# Patient Record
Sex: Female | Born: 2005
Health system: Southern US, Community
[De-identification: ages and names within clinical notes are randomized; demographics above are authoritative.]

## PROBLEM LIST (undated history)

## (undated) DIAGNOSIS — F909 Attention-deficit hyperactivity disorder, unspecified type: Secondary | ICD-10-CM

## (undated) DIAGNOSIS — F32A Depression, unspecified: Secondary | ICD-10-CM

## (undated) DIAGNOSIS — E282 Polycystic ovarian syndrome: Secondary | ICD-10-CM

## (undated) HISTORY — DX: Attention-deficit hyperactivity disorder, unspecified type: F90.9

---
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
## 2023-01-24 IMAGING — US US PELVIS COMPLETE
1 series · 13 of 25 positions shown · non-contrast
Comparison: None.

CLINICAL DATA: LEFT lower quadrant pain

EXAM:
TRANSABDOMINAL ULTRASOUND OF PELVIS
DOPPLER ULTRASOUND OF OVARIES
TECHNIQUE: Transabdominal ultrasound examination of the pelvis was performed
including evaluation of the uterus, ovaries, adnexal regions, and
pelvic cul-de-sac.
Color and duplex Doppler ultrasound was utilized to evaluate blood
flow to the ovaries.

[Series 1: us pelvis (transabdominal only) · 13 of 97 slices shown]
[im 1/97]
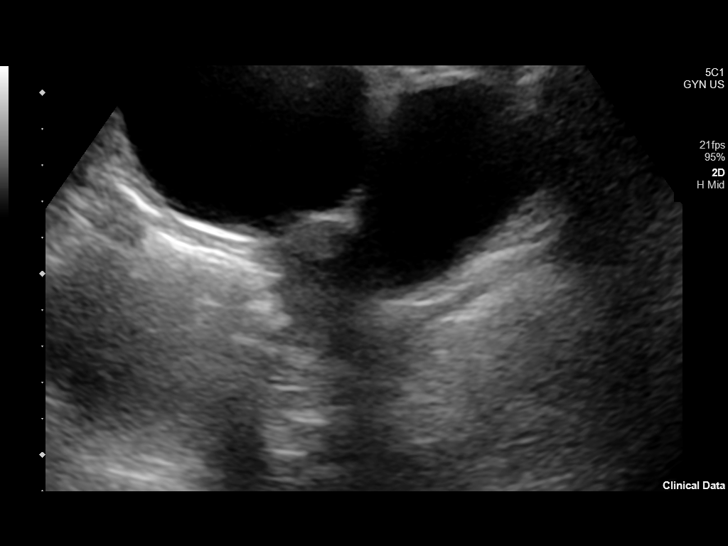
[im 9/97]
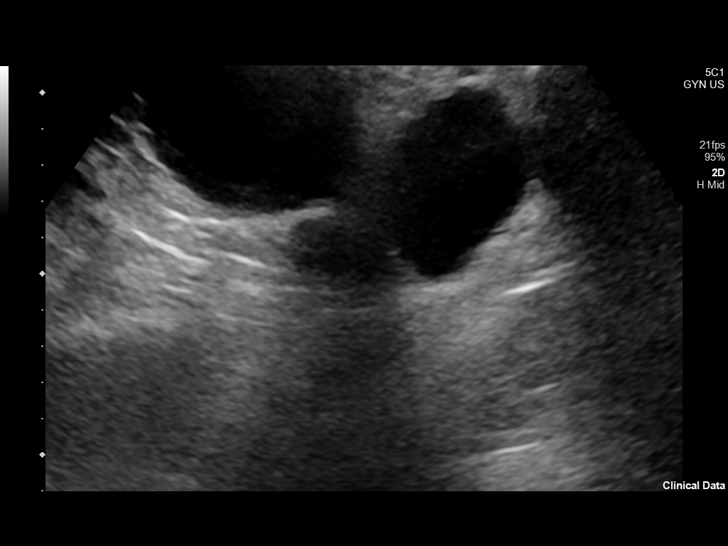
[im 17/97]
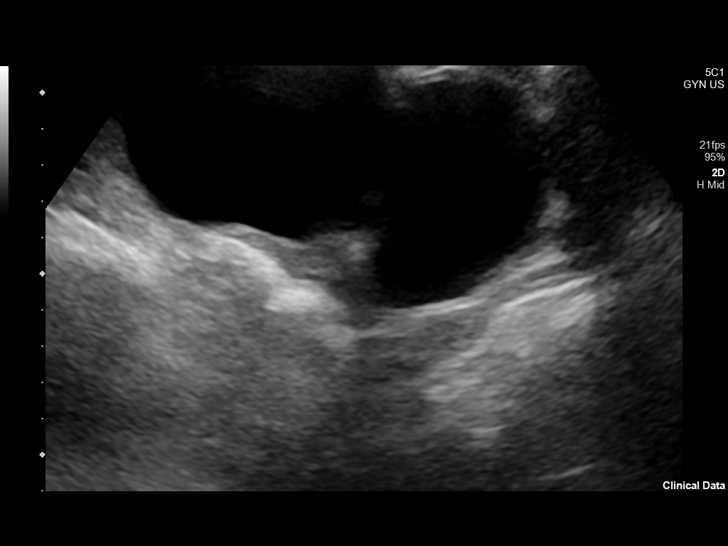
[im 25/97]
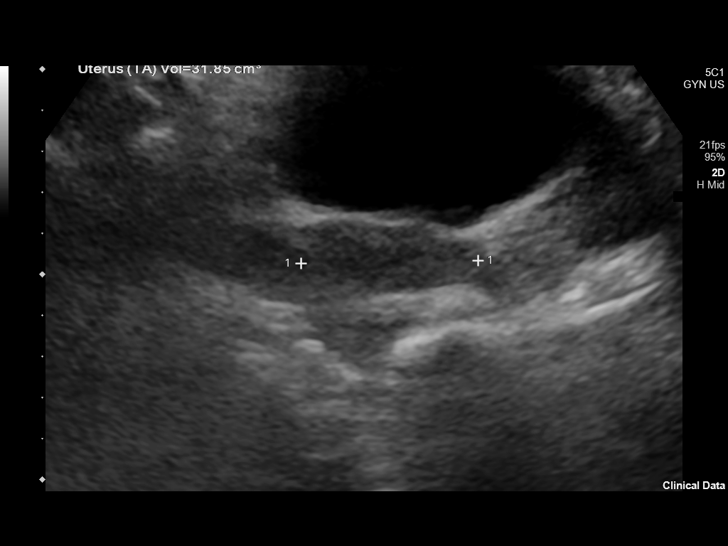
[im 33/97]
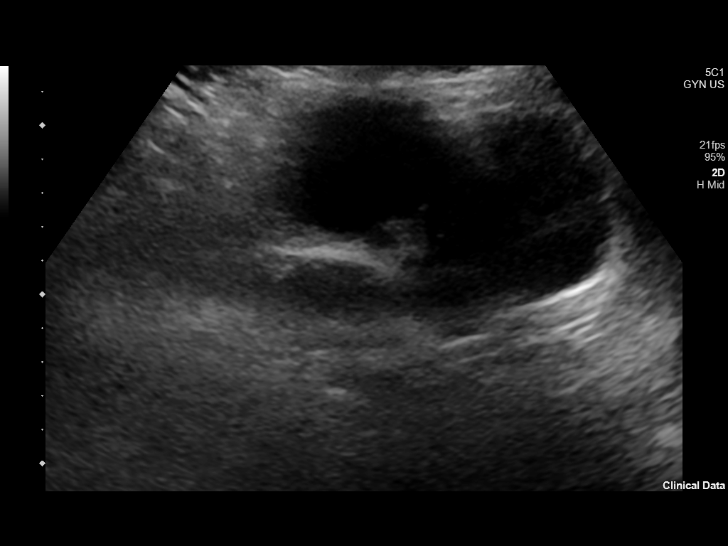
[im 41/97]
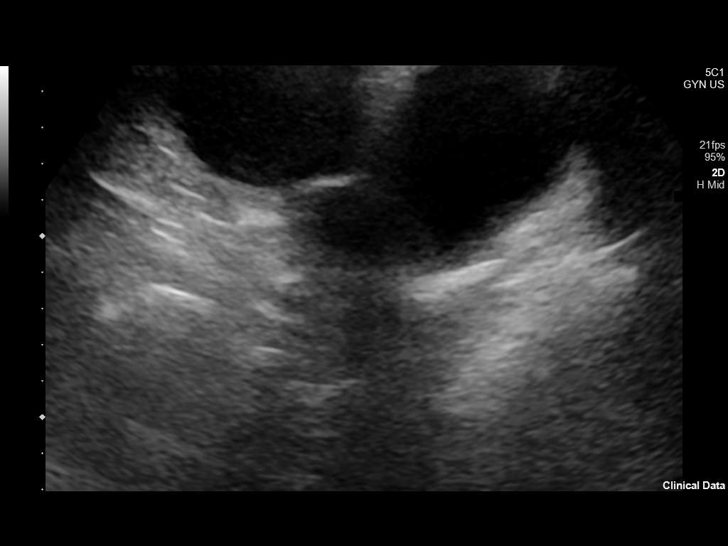
[im 49/97]
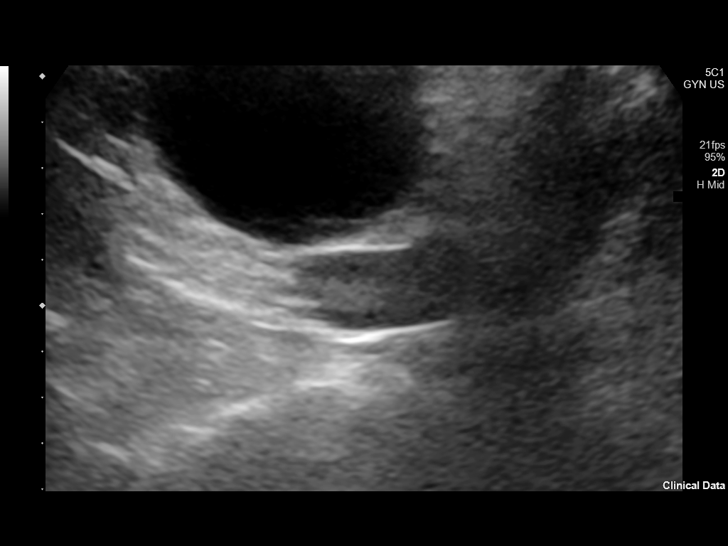
[im 57/97]
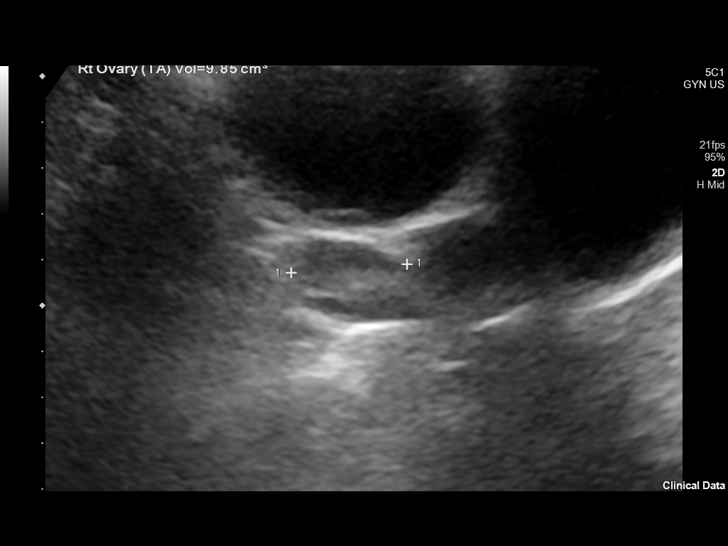
[im 65/97]
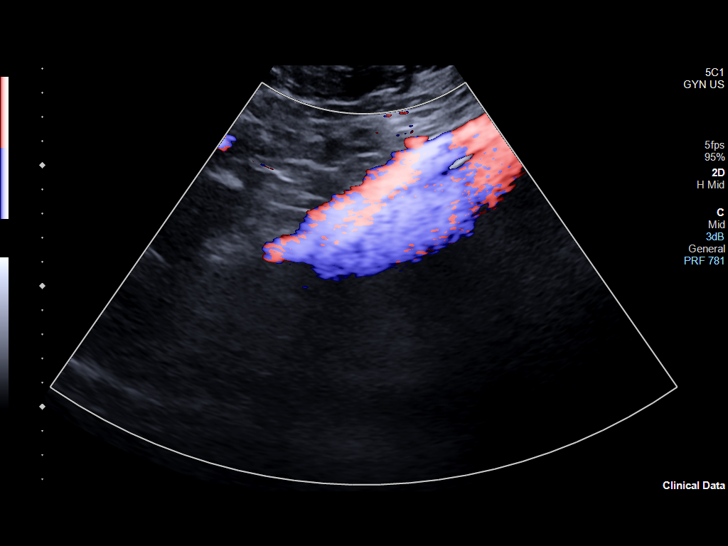
[im 73/97]
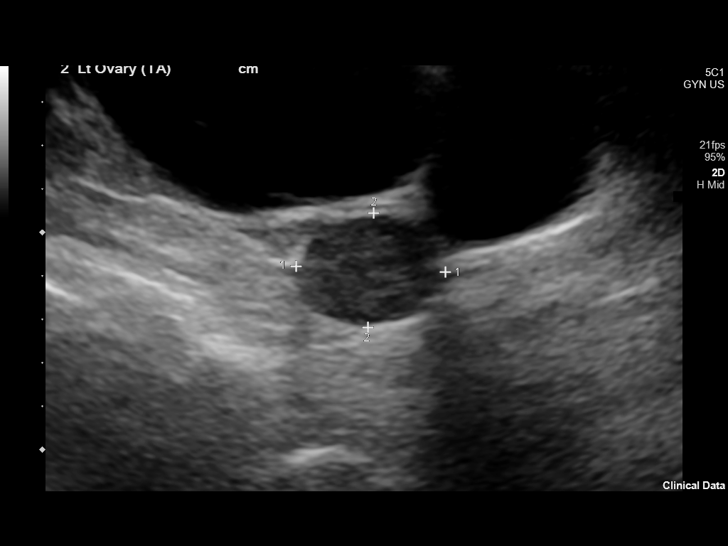
[im 81/97]
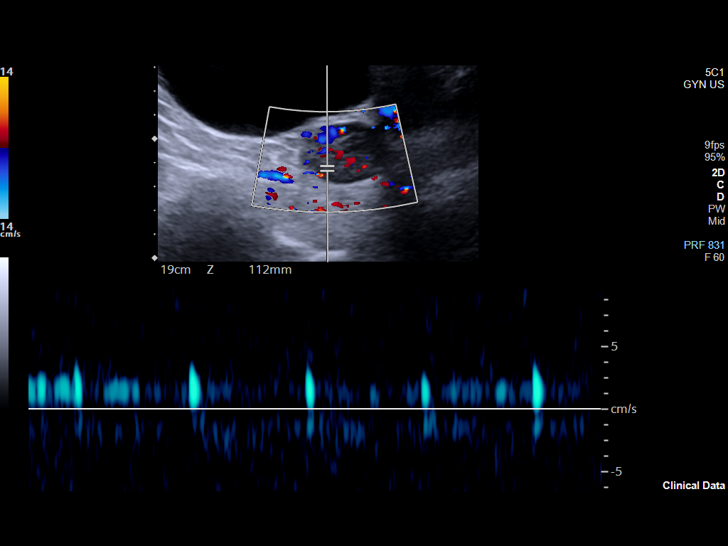
[im 89/97]
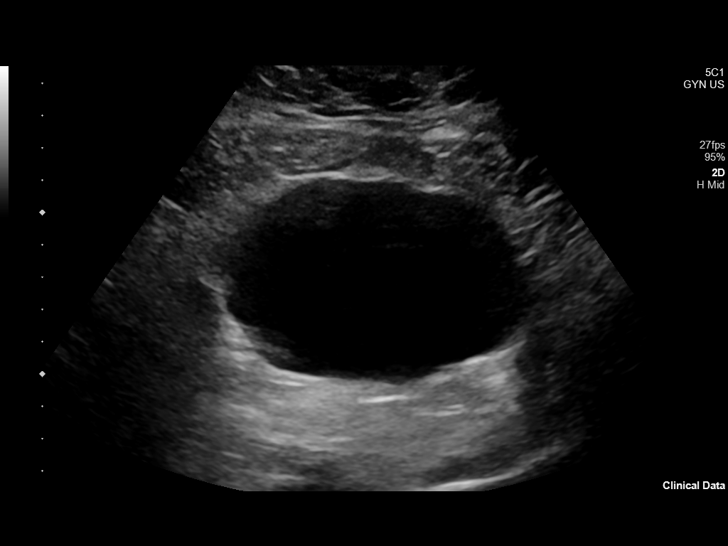
[im 97/97]
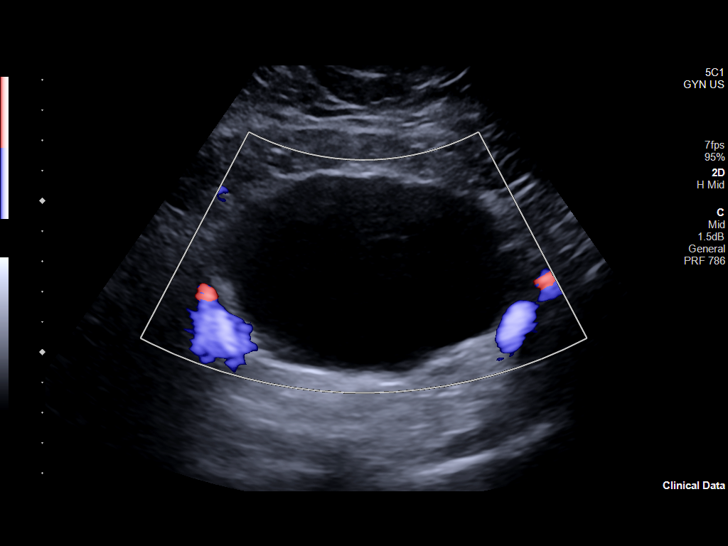

[13 of 25 positions shown; findings below may reference images not displayed]

FINDINGS: Uterus

Limited visualization. Measurements: 5.2 x 2.7 x 4.3 cm = volume: 32
mL. No fibroids or other mass visualized.

Endometrium

Limited visualization given transabdominal technique.

Right ovary

Measurements: 4.1 x 1.8 x 2.5 cm = volume: 10 mL.

Left ovary

Measurements: 3.4 x 2.6 x 3.8 cm = volume: 18 mL.

Pulsed Doppler evaluation demonstrates normal low-resistance
arterial and venous waveforms in both ovaries.

Other: There is a midline simple appearing cystic mass which appears
distinct from the bladder candidate. It measures 8.3 x 6.0 x 9.2 cm.
This likely reflects adnexal cyst although is difficult to delineate
from which adnexa this arises. No internal blood flow is identified
within this mass.
IMPRESSION: 1. There is a midline simple appearing 9.2 cm cystic mass which
appears to be distinct from adjacent bladder; evaluation is limited
due to transabdominal technique. This may reflect an adnexal cyst
and could reflect a source of pain and potential focus for
intermittent torsion. Blood flow is documented within the bilateral
ovaries. This could be further assessed with dedicated
cross-sectional imaging with contrast enhanced abdomen/pelvis CT or
pelvic MRI.

## 2023-05-28 ENCOUNTER — Encounter (INDEPENDENT_AMBULATORY_CARE_PROVIDER_SITE_OTHER): Payer: Self-pay

## 2023-06-12 ENCOUNTER — Encounter: Payer: Self-pay | Admitting: Obstetrics and Gynecology

## 2023-06-12 ENCOUNTER — Ambulatory Visit: Payer: 59 | Admitting: Obstetrics and Gynecology

## 2023-06-12 ENCOUNTER — Other Ambulatory Visit (HOSPITAL_COMMUNITY)
Admission: RE | Admit: 2023-06-12 | Discharge: 2023-06-12 | Disposition: A | Discharge status: Home / Self Care | Payer: 59 | Source: Ambulatory Visit | Attending: Obstetrics and Gynecology | Admitting: Obstetrics and Gynecology

## 2023-06-12 VITALS — BP 126/82 | HR 101 | Ht 67.0 in | Wt 288.0 lb

## 2023-06-12 DIAGNOSIS — Z113 Encounter for screening for infections with a predominantly sexual mode of transmission: Secondary | ICD-10-CM | POA: Insufficient documentation

## 2023-06-12 DIAGNOSIS — Z308 Encounter for other contraceptive management: Secondary | ICD-10-CM | POA: Diagnosis not present

## 2023-06-12 DIAGNOSIS — N921 Excessive and frequent menstruation with irregular cycle: Secondary | ICD-10-CM

## 2023-06-12 MED ORDER — NORELGESTROMIN-ETH ESTRADIOL 150-35 MCG/24HR TD PTWK: 1.0000 | MEDICATED_PATCH | TRANSDERMAL | 6 refills | Status: DC

## 2023-06-12 NOTE — Progress Notes (Signed)
GYNECOLOGY  VISIT   HPI: 18 y.o.   Single  Caucasian female   No obstetric history on file. with Patient's last menstrual period was 05/29/2023.   here for: change BC. Pt has irregular cycles and pressure.     Taking her birth control regularly overall.  Takes the pill late once or twice a month.  Is having some spotting and cramping.  Spotting about 4 times a month when she is not on her cycle. Cramps even if she is not bleeding.   Is sexually active.  She states that this starts her bleeding. Partner is female biologically.  Using condoms.   Concerned about an ovarian cyst. Hx torsion.  No dysuria.  Voiding normally.   GYNECOLOGIC HISTORY: Patient's last menstrual period was 05/29/2023. Contraception:  OCP Menopausal hormone therapy:  n/a Last 2 paps:  n/a History of abnormal Pap or positive HPV:  no Mammogram:  n/a        OB History   No obstetric history on file.        Patient Active Problem List   Diagnosis Date Noted   Cyst of left ovary    LLQ pain    Torsion of left ovary and ovarian pedicle    Prediabetes 06/03/2016   Vitamin D deficiency 06/03/2016   BMI (body mass index), pediatric, greater than or equal to 95% for age 37/19/2018    Past Medical History:  Diagnosis Date   ADHD (attention deficit hyperactivity disorder)    Depressed    PCO (polycystic ovaries)     Past Surgical History:  Procedure Laterality Date   ROBOTIC ASSISTED LAPAROSCOPIC OVARIAN CYSTECTOMY Left 05/05/2021   Procedure: XI ROBOTIC ASSISTED LAPAROSCOPIC OVARIAN CYSTECTOMY;  Surgeon: Natale Milch, MD;  Location: [facility] ORS;  Service: Gynecology;  Laterality: Left;    Current Outpatient Medications  Medication Sig Dispense Refill   albuterol (VENTOLIN HFA) 108 (90 Base) MCG/ACT inhaler Inhale into the lungs.     ibuprofen (ADVIL) 600 MG tablet Take 1 tablet (600 mg total) by mouth every 6 (six) hours as needed. 60 tablet 0   norethindrone-ethinyl estradiol-iron  (JUNEL FE 1.5/30) 1.5-30 MG-MCG tablet Take 1 active pill daily x 12 weeks, then take 1 inactive pill daily x 7 days 112 tablet 3   No current facility-administered medications for this visit.     ALLERGIES: Patient has no known allergies.  History reviewed. No pertinent family history.  Social History   Socioeconomic History   Marital status: Single    Spouse name: Not on file   Number of children: Not on file   Years of education: Not on file   Highest education level: Not on file  Occupational History   Not on file  Tobacco Use   Smoking status: Never    Passive exposure: Yes   Smokeless tobacco: Never  Vaping Use   Vaping status: Never Used  Substance and Sexual Activity   Alcohol use: Not Currently    Comment: occas   Drug use: Not Currently    Types: Marijuana    Comment: not often   Sexual activity: Yes  Other Topics Concern   Not on file  Social History Narrative   [employer] 12th grade- in spring 2024 going to [facility] for cosmatology    Lives with mom and siblings mom and dad split time   Has pets a lot of them    Enjoys playing video games. Drawing and painting.    Social Drivers  of Health   Financial Resource Strain: Not on file  Food Insecurity: Not on file  Transportation Needs: Not on file  Physical Activity: Not on file  Stress: Not on file  Social Connections: Not on file  Intimate Partner Violence: Not on file    Review of Systems  All other systems reviewed and are negative.   PHYSICAL EXAMINATION:   BP 126/82 (BP Location: Left Arm, Patient Position: Sitting, Cuff Size: Large)   Pulse (!) 101   Ht 5\' 7"  (1.702 m)   Wt 288 lb (130.6 kg)   LMP 05/29/2023   SpO2 99%   BMI 45.11 kg/m     General appearance: alert, cooperative and appears stated age   Pelvic: External genitalia:  no lesions              Urethra:  normal appearing urethra with no masses, tenderness or lesions              Bartholins and Skenes: normal                  Vagina: normal appearing vagina with normal color and discharge, no lesions              Cervix: no lesions                Bimanual Exam:  Uterus:  normal size, contour, position, consistency, mobility, non-tender              Adnexa: no mass, fullness, tenderness     Chaperone was present for exam:  Warren Lacy, CMA  ASSESSMENT:  Breakthrough bleeding on birth control.  Pelvic pressure. Normal pelvic exam today. Hx robotic assisted ovarian cystectomy for benign serous cystadenofibroma. Had a torsion.  STD screening.   Elevated testosterone.  Probable PCOS. Elevated DHEA-S.  Followed by endocrinology. Possible migraine without aura.  PLAN:  We discussed breakthrough bleeding with birth control.  Contraceptive options of switching to another birth control pill, OrthoEvra, NuvaRing, Nexplanon, Depo Provera, IUDs discussed.  She chooses Chief Strategy Officer.  Instructed in use and in side effects.  GC/CT/trich testing.  Condoms recommended.  Declines pelvic US.  Follow up for annual exam and prn.    30 min  total time was spent for this patient encounter, including preparation, face-to-face counseling with the patient, coordination of care, and documentation of the encounter.

## 2023-06-12 NOTE — Patient Instructions (Signed)
Ethinyl Estradiol; Norelgestromin Patches What is this medication? ETHINYL ESTRADIOL;NORELGESTROMIN (ETH in il es tra DYE ole; nor el JES troe min) prevents ovulation and pregnancy. It belongs to a group of medications called contraceptives. It is a combination of the hormones estrogen and progestin. This medicine may be used for other purposes; ask your health care provider or pharmacist if you have questions. COMMON BRAND NAME(S): Ortho Christianne Borrow, ZAFEMY What should I tell my care team before I take this medication? They need to know if you have or ever had any of these conditions: Abnormal vaginal bleeding Blood clots Blood vessel disease Breast, cervical, endometrial, ovarian, liver, or uterine cancer Diabetes Gallbladder disease Having surgery Heart disease or recent heart attack High blood pressure High cholesterol or triglycerides History of irregular heartbeat or heart valve problems Kidney disease Liver disease Lupus Migraine headaches Protein C or S deficiency Recently had a baby, miscarriage, or abortion Stroke Tobacco use An unusual or allergic reaction to estrogens, progestins, other medications, foods, dyes, or preservatives Pregnant or trying to get pregnant Breastfeeding How should I use this medication? This patch is applied to the skin. Follow the directions on the prescription label. Apply to clean, dry, healthy skin on the buttock, abdomen, upper outer arm or upper torso, in a place where it will not be rubbed by tight clothing. Do not use lotions or other cosmetics on the site where the patch will go. Press the patch firmly in place for 10 seconds to ensure good contact with the skin. Change the patch every 7 days on the same day of the week for 3 weeks. You will then have a break from the patch for 1 week, after which you will apply a new patch. Do not use your medication more often than directed. Contact your care team about the use of this medication in  children. Special care may be needed. This medication has been used in female children who have started having menstrual periods. A patient package insert for the product will be given with each prescription and refill. Read this sheet carefully each time. The sheet may change frequently. Overdosage: If you think you have taken too much of this medicine contact a poison control center or emergency room at once. NOTE: This medicine is only for you. Do not share this medicine with others. What if I miss a dose? You will need to replace your patch once a week as directed. If your patch is lost or falls off, contact your care team for advice. You may need to use another form of birth control if your patch has been off for more than 1 day. What may interact with this medication? Do not take this medication with the following: Dasabuvir; ombitasvir; paritaprevir; ritonavir Ombitasvir; paritaprevir; ritonavir This medication may also interact with the following: Acetaminophen Antibiotics or medications for infections, especially rifampin, rifabutin, rifapentine, penicillins, or tetracyclines Aprepitant or fosaprepitant Armodafinil Ascorbic acid (vitamin C) Barbiturate medications, such as phenobarbital or primidone Bosentan Certain antivirals for HIV or hepatitis Certain medications for cancer treatment Certain medications for cholesterol Certain medications for seizures, such as carbamazepine, clobazam, felbamate, lamotrigine, oxcarbazepine, phenytoin, rufinamide, or topiramate Cyclosporine Dantrolene Elagolix Flibanserin Grapefruit juice Lesinurad Medications for diabetes Medications to treat fungal infections, such as griseofulvin, miconazole, fluconazole, ketoconazole, itraconazole, posaconazole, or voriconazole Mifepristone Mitotane Modafinil Morphine Mycophenolate St. John's wort Tamoxifen Temazepam Theophylline or aminophylline Thyroid hormones Tizanidine Tranexamic  acid Ulipristal Warfarin This list may not describe all possible interactions. Give your health care provider  a list of all the medicines, herbs, non-prescription drugs, or dietary supplements you use. Also tell them if you smoke, drink alcohol, or use illegal drugs. Some items may interact with your medicine. What should I watch for while using this medication? Visit your care team for regular checks on your progress. You will need a regular breast and pelvic exam and Pap smear while on this medication. Use an additional method of contraception during the first cycle that you use this patch. If you have any reason to think you are pregnant, stop using this medication right away and contact your care team. If you are using this medication for hormone related problems, it may take several cycles of use to see improvement in your condition. Smoking tobacco increases the risk of getting a blood clot or having a stroke while you are taking this medication, especially if you are older than 35 years. This medication can make your body retain fluid, making your fingers, hands, or ankles swell. Your blood pressure can go up. Contact your care team if you feel you are retaining fluid. This medication can make you more sensitive to the sun. Keep out of the sun. If you cannot avoid being in the sun, wear protective clothing and sunscreen. Do not use sun lamps, tanning beds, or tanning booths. If you wear contact lenses and notice visual changes, or if the lenses begin to feel uncomfortable, consult your eye care specialist. Tenderness, swelling, or minor bleeding of the gums may occur. Talk to your dentist if this happens. Brushing and flossing your teeth regularly may reduce the risk of side effects. Visit your dentist on a regular basis. Tell your dentist about any medications you are taking. If you are going to have elective surgery or an MRI, tell your care team that you are using this medication. You may  need to remove the patch before the procedure. Using this medication does not protect you or your partner against HIV or other sexually transmitted infections (STIs). What side effects may I notice from receiving this medication? Side effects that you should report to your care team as soon as possible: Allergic reactions--skin rash, itching, hives, swelling of the face, lips, tongue, or throat Blood clot--pain, swelling, or warmth in the leg, shortness of breath, chest pain Gallbladder problems--severe stomach pain, nausea, vomiting, fever Increase in blood pressure Liver injury--right upper belly pain, loss of appetite, nausea, light-colored stool, dark yellow or brown urine, yellowing skin or eyes, unusual weakness or fatigue New or worsening migraines or headaches Stroke--sudden numbness or weakness of the face, arm, or leg, trouble speaking, confusion, trouble walking, loss of balance or coordination, dizziness, severe headache, change in vision Unusual vaginal discharge, itching, or odor Worsening mood, feelings of depression Side effects that usually do not require medical attention (report to your care team if they continue or are bothersome): Breast pain or tenderness Dark patches of skin on the face or other sun-exposed areas Irregular menstrual cycles or spotting Nausea Weight gain This list may not describe all possible side effects. Call your doctor for medical advice about side effects. You may report side effects to FDA at 1-800-FDA-1088. Where should I keep my medication? Keep out of the reach of children and pets. Store at room temperature between 15 and 30 degrees C (59 and 86 degrees F). Keep the patch in its pouch until time of use. Throw away any unused medication after the expiration date. Dispose of used patches properly. Since a used patch may  still contain active hormones, fold the patch in half so that it sticks to itself prior to disposal. Throw away in a place where  children or pets cannot reach. NOTE: This sheet is a summary. It may not cover all possible information. If you have questions about this medicine, talk to your doctor, pharmacist, or health care provider.  2024 Elsevier/Gold Standard (2021-12-04 00:00:00)

## 2023-06-13 LAB — CERVICOVAGINAL ANCILLARY ONLY
Chlamydia: NEGATIVE
Comment: NEGATIVE
Comment: NEGATIVE
Comment: NORMAL
Neisseria Gonorrhea: NEGATIVE
Trichomonas: NEGATIVE

## 2023-06-16 ENCOUNTER — Encounter: Payer: Self-pay | Admitting: Obstetrics and Gynecology

## 2023-07-21 ENCOUNTER — Encounter: Payer: Self-pay | Admitting: Obstetrics and Gynecology

## 2023-07-21 NOTE — Telephone Encounter (Signed)
 Patient also left message on triage line.   Spoke with patient. Reports hard right breast lump found on SBE 07/19/23. LMP 07/20/23. Right nipple is dry. Denies any other symptoms.   OV scheduled for 3/13/ at 1000 with Dr. Edward Jolly.   Recommended patient not press or squeeze lump in the meantime. Can take OTC motrin 800 mg q8hrs for pain, if needed. Advised once OV complete our office will assist with scheduling any f/u needed.   Routing to provider for final review. Patient is agreeable to disposition. Will close encounter.

## 2023-07-23 ENCOUNTER — Encounter: Payer: Self-pay | Admitting: Obstetrics and Gynecology

## 2023-07-23 NOTE — Progress Notes (Unsigned)
 GYNECOLOGY  VISIT   HPI: 18 y.o.   Single  Caucasian female   No obstetric history on file. with Patient's last menstrual period was 07/19/2023 (exact date).   here for: right breast lump noticed since 07/19/2023. Pt reports feeling a hard spot, marble sized, but feels harder around edges. Soreness with palpation. Reports fx hx in mother and maternal grandmother w/ breast cancer. Also, has hx of endometrial cancer in paternal Grandmother, and ovarian and breast cancer in paternal great grandmother.  Denies nipple discharge but dry skin. Pt also concerned with possible allergy with BC patches.   Mood swings and increased cramping with her last cycle with use of Ortho Evra, but no symptoms if not on her cycle.  The skin on her abdomen has red marks where a patch has been placed.  Was using Junel birth control pills previously.   Mother diagnosed with breast cancer at age 20 yo.  Negative genetic testing for BRCA.    Step mother is with her today.   GYNECOLOGIC HISTORY: Patient's last menstrual period was 07/19/2023 (exact date). Contraception: Patch Menopausal hormone therapy: n/a Last 2 paps: n/a History of abnormal Pap or positive HPV: n/a Mammogram: n/a        OB History   No obstetric history on file.        Patient Active Problem List   Diagnosis Date Noted   Cyst of left ovary    LLQ pain    Torsion of left ovary and ovarian pedicle    Prediabetes 06/03/2016   Vitamin D deficiency 06/03/2016   BMI (body mass index), pediatric, greater than or equal to 95% for age 49/19/2018    Past Medical History:  Diagnosis Date   ADHD (attention deficit hyperactivity disorder)    Depressed    PCO (polycystic ovaries)     Past Surgical History:  Procedure Laterality Date   ROBOTIC ASSISTED LAPAROSCOPIC OVARIAN CYSTECTOMY Left 05/05/2021   Procedure: XI ROBOTIC ASSISTED LAPAROSCOPIC OVARIAN CYSTECTOMY;  Surgeon: Natale Milch, MD;  Location: [facility] ORS;  Service:  Gynecology;  Laterality: Left;    Current Outpatient Medications  Medication Sig Dispense Refill   albuterol (VENTOLIN HFA) 108 (90 Base) MCG/ACT inhaler Inhale into the lungs.     ibuprofen (ADVIL) 600 MG tablet Take 1 tablet (600 mg total) by mouth every 6 (six) hours as needed. 60 tablet 0   norelgestromin-ethinyl estradiol Burr Medico) 150-35 MCG/24HR transdermal patch Place 1 patch onto the skin once a week. Change the patch weekly for 3 weeks and then wear no patch for one week.  Then restart the process again. 3 patch 6   No current facility-administered medications for this visit.     ALLERGIES: Patient has no known allergies.  History reviewed. No pertinent family history.  Social History   Socioeconomic History   Marital status: Single    Spouse name: Not on file   Number of children: Not on file   Years of education: Not on file   Highest education level: Not on file  Occupational History   Not on file  Tobacco Use   Smoking status: Never    Passive exposure: Yes   Smokeless tobacco: Never  Vaping Use   Vaping status: Never Used  Substance and Sexual Activity   Alcohol use: Not Currently    Comment: occas   Drug use: Not Currently    Types: Marijuana    Comment: not often   Sexual activity: Yes    Birth  control/protection: Patch  Other Topics Concern   Not on file  Social History Narrative   [employer] 12th grade- in spring 2024 going to [employer] [facility] for cosmatology    Lives with mom and siblings mom and dad split time   Has pets a lot of them    Enjoys playing video games. Drawing and painting.    Social Drivers of Corporate investment banker Strain: Not on file  Food Insecurity: Not on file  Transportation Needs: Not on file  Physical Activity: Not on file  Stress: Not on file  Social Connections: Not on file  Intimate Partner Violence: Not on file    Review of Systems  All other systems reviewed and are negative.   PHYSICAL EXAMINATION:   BP  112/80   Pulse (!) 104   LMP 07/19/2023 (Exact Date)   SpO2 98%     General appearance: alert, cooperative and appears stated age Neck: no adenopathy, supple, symmetrical, trachea midline and thyroid normal to inspection and palpation Breasts: right - normal appearance, 5 mm lump at 12:00, no tenderness, No nipple retraction or dimpling, No nipple discharge or bleeding, No axillary or supraclavicular adenopathy Left - normal appearance, no masses or tenderness, No nipple retraction or dimpling, No nipple discharge or bleeding, No axillary or supraclavicular adenopathy   Skin: lower abdomen with scattered areas of mild erythema and dryness to the skin in an outline of the patch size.   Chaperone was present for exam:  Rosette Reveal, CMA  ASSESSMENT:  Right breast mass.  Family history of breast, endometrial, and ovarian cancer.   Skin irritation from the Ortho Evra patch.    PLAN:  We discussed that fibrocystic change of the breast may be the cause for the lump noted today and that we want additional information with breast imaging.  [facility]  Referral for genetic counseling and testing.  Patient desires this.  We talked about Nuvaring as a treatment alternative to the Ortho Evra patch, and patient declined.  We discussed continuous patch use as a way to avoid potential mood swings and menstrual cramping.   Patient will continue to use the patch as directed and monitor her response to it, including her skin reaction.  Hydrocortisone cream may be an option to treat the skin irritation.  FU for annual exam and prn.    33 min  total time was spent for this patient encounter, including preparation, face-to-face counseling with the patient, coordination of care, and documentation of the encounter.

## 2023-07-24 ENCOUNTER — Telehealth: Payer: Self-pay | Admitting: Obstetrics and Gynecology

## 2023-07-24 ENCOUNTER — Encounter: Payer: Self-pay | Admitting: Obstetrics and Gynecology

## 2023-07-24 ENCOUNTER — Ambulatory Visit (INDEPENDENT_AMBULATORY_CARE_PROVIDER_SITE_OTHER): Admitting: Obstetrics and Gynecology

## 2023-07-24 VITALS — BP 112/80 | HR 104

## 2023-07-24 DIAGNOSIS — N6315 Unspecified lump in the right breast, overlapping quadrants: Secondary | ICD-10-CM | POA: Diagnosis not present

## 2023-07-24 DIAGNOSIS — Z3045 Encounter for surveillance of transdermal patch hormonal contraceptive device: Secondary | ICD-10-CM | POA: Diagnosis not present

## 2023-07-24 DIAGNOSIS — Z803 Family history of malignant neoplasm of breast: Secondary | ICD-10-CM

## 2023-07-24 NOTE — Telephone Encounter (Signed)
 Please schedule right breast US for my patient at [facility].   She has a 5 mm lump at 12:00 and family history of breast cancer in her mother at age 20 yo.

## 2023-07-24 NOTE — Telephone Encounter (Signed)
 Spoke with Scarlette Calico at [facility]. Patient scheduled for right breast US on 08/04/23 at 1245.  Spoke with patient, advised as seen above. Patient verbalizes understanding and is agreeable.   Encounter closed.

## 2023-07-25 DIAGNOSIS — Z803 Family history of malignant neoplasm of breast: Secondary | ICD-10-CM | POA: Insufficient documentation

## 2023-08-04 ENCOUNTER — Other Ambulatory Visit: Payer: Self-pay | Admitting: Obstetrics and Gynecology

## 2023-08-04 ENCOUNTER — Ambulatory Visit
Admission: RE | Admit: 2023-08-04 | Discharge: 2023-08-04 | Disposition: A | Discharge status: Home / Self Care | Source: Ambulatory Visit | Attending: Obstetrics and Gynecology | Admitting: Obstetrics and Gynecology

## 2023-08-04 DIAGNOSIS — N6315 Unspecified lump in the right breast, overlapping quadrants: Secondary | ICD-10-CM

## 2023-08-04 DIAGNOSIS — N6310 Unspecified lump in the right breast, unspecified quadrant: Secondary | ICD-10-CM

## 2023-08-04 DIAGNOSIS — Z803 Family history of malignant neoplasm of breast: Secondary | ICD-10-CM

## 2023-08-06 ENCOUNTER — Ambulatory Visit
Admission: RE | Admit: 2023-08-06 | Discharge: 2023-08-06 | Discharge status: Home / Self Care | Source: Ambulatory Visit | Attending: Obstetrics and Gynecology | Admitting: Obstetrics and Gynecology

## 2023-08-06 DIAGNOSIS — N6310 Unspecified lump in the right breast, unspecified quadrant: Secondary | ICD-10-CM

## 2023-08-06 HISTORY — PX: BREAST BIOPSY: SHX20

## 2023-08-07 LAB — SURGICAL PATHOLOGY

## 2023-08-11 ENCOUNTER — Encounter: Payer: Self-pay | Admitting: Genetic Counselor

## 2023-08-11 ENCOUNTER — Inpatient Hospital Stay: Attending: Genetic Counselor | Admitting: Genetic Counselor

## 2023-08-11 ENCOUNTER — Inpatient Hospital Stay

## 2023-08-11 DIAGNOSIS — Z803 Family history of malignant neoplasm of breast: Secondary | ICD-10-CM | POA: Diagnosis not present

## 2023-08-11 NOTE — Progress Notes (Signed)
 REFERRING PROVIDER: Patton Salles, MD [address] [place],  [place] [address]  PRIMARY PROVIDER:  Patient, No Pcp Per  PRIMARY REASON FOR VISIT:  1. Family history of breast cancer    HISTORY OF PRESENT ILLNESS:   Ms. Ovitt, a 18 y.o. female, was seen for a [facility] cancer genetics consultation at the request of Dr. Ardell Isaacs due to a family history of cancer.  Ms. Cerro presents to clinic today to discuss the possibility of a hereditary predisposition to cancer, genetic testing, and to further clarify her future cancer risks, as well as potential cancer risks for family members.   Ms. Stailey is a 18 y.o. female with no personal history of cancer.    RISK FACTORS:  Menarche was at age 89.  Nulliparous.  OCP use for approximately 2-3 years, currently on patch  Ovaries intact: yes.  Hysterectomy: no.  Menopausal status: premenopausal.  HRT use: 0 years. Colonoscopy: no; not examined. Mammogram within the last year: no. Number of breast biopsies: 1. 08/07/23, fibroadenoma.  Up to date with pelvic exams: yes. Any excessive radiation exposure in the past: no  Past Medical History:  Diagnosis Date   ADHD (attention deficit hyperactivity disorder)    Depressed    PCO (polycystic ovaries)     Past Surgical History:  Procedure Laterality Date   BREAST BIOPSY Right 08/06/2023   [place] RT BREAST BX W LOC DEV 1ST LESION IMG BX SPEC US GUIDE 08/06/2023 [facility]   ROBOTIC ASSISTED LAPAROSCOPIC OVARIAN CYSTECTOMY Left 05/05/2021   Procedure: XI ROBOTIC ASSISTED LAPAROSCOPIC OVARIAN CYSTECTOMY;  Surgeon: Natale Milch, MD;  Location: [facility] ORS;  Service: Gynecology;  Laterality: Left;    Social History   Socioeconomic History   Marital status: Single    Spouse name: Not on file   Number of children: Not on file   Years of education: Not on file   Highest education level: Not on file  Occupational History   Not on file  Tobacco Use    Smoking status: Never    Passive exposure: Yes   Smokeless tobacco: Never  Vaping Use   Vaping status: Never Used  Substance and Sexual Activity   Alcohol use: Not Currently    Comment: occas   Drug use: Not Currently    Types: Marijuana    Comment: not often   Sexual activity: Yes    Birth control/protection: Patch  Other Topics Concern   Not on file  Social History Narrative   [employer] 12th grade- in spring 2024 going to [employer] for cosmatology    Lives with mom and siblings mom and dad split time   Has pets a lot of them    Enjoys playing video games. Drawing and painting.    Social Drivers of Corporate investment banker Strain: Not on file  Food Insecurity: Not on file  Transportation Needs: Not on file  Physical Activity: Not on file  Stress: Not on file  Social Connections: Not on file     FAMILY HISTORY:  We obtained a detailed, 4-generation family history.  Significant diagnoses are listed below: Family History  Problem Relation Age of Onset   Breast cancer Mother 8   Lung cancer Maternal Grandmother    Brain cancer Maternal Grandfather    Endometrial cancer Paternal Grandmother 32   Breast cancer Maternal Great-grandmother     Ms. Start is reports her mother had genetic testing of BRCA1/2  approximately 10 years ago, and reports that this test was negative/normal. There is no reported Ashkenazi Jewish ancestry. There is no known consanguinity.  Ms. Fielder reports her mother was diagnosed with breast cancer at age 57, treated with bilateral mastectomy and chemotherapy. She reports her maternal grandfather was diagnosed with brain cancer and passed away in his 25s. She reports her maternal grandmother was diagnosed with lung cancer, history of cigarette use, passed away in her 78s. She reports a maternal great-grandmother diagnosed with breast cancer in her 46s that passed away around age 56. Ms. Roorda reports her paternal grandmother was diagnosed with  endometrial cancer, metastatic, at age 92 and is living at 3. She reports her paternal grandmother's mother was diagnosed with stomach cancer in her 47s and passed away at 53.   GENETIC COUNSELING ASSESSMENT: Ms. Arreaga is a 18 y.o. female with a family history of cancer which is somewhat suggestive of an inherited predisposition to cancer. We, therefore, discussed and recommended the following at today's visit.   DISCUSSION: We discussed that, in general, most cancer is not inherited in families, but instead is sporadic or familial. Sporadic cancers occur by chance and typically happen at older ages (>50 years) as this type of cancer is caused by genetic changes acquired during an individual's lifetime. Some families have more cancers than would be expected by chance; however, the ages or types of cancer are not consistent with a known genetic mutation or known genetic mutations have been ruled out. This type of familial cancer is thought to be due to a combination of multiple genetic, environmental, hormonal, and lifestyle factors. While this combination of factors likely increases the risk of cancer, the exact source of this risk is not currently identifiable or testable.  We discussed that 5 - 10% of cancer is hereditary. We discussed that testing is beneficial for several reasons including knowing how to screen individuals for cancer, considering options for prevention and to understand if other family members could be at risk for cancer and allow them to undergo genetic testing.   We reviewed the characteristics, features and inheritance patterns of hereditary cancer syndromes. We also discussed genetic testing, including the appropriate family members to test, the process of testing, insurance coverage and turn-around-time for results. We discussed the implications of a negative, positive, carrier and/or variant of uncertain significant result. Ms. Boggan  was offered a common hereditary cancer panel  (36+ genes) and an expanded pan-cancer panel (70+ genes). Ms. Buskey was informed of the benefits and limitations of each panel, including that expanded pan-cancer panels contain genes that do not have clear management guidelines at this point in time.  We also discussed that as the number of genes included on a panel increases, the chances of variants of uncertain significance increases.  Based on Ms. Rajagopalan's family history of cancer, she meets medical criteria for genetic testing. However, based on her mom's history of breast cancer at age 52 and history of limited genetic testing, her mom would be the most informative family member to have updated genetic testing.   We discussed that some people do not want to undergo genetic testing due to fear of genetic discrimination.  The Genetic Information Nondiscrimination Act (GINA) was signed into federal law in 2008. GINA prohibits health insurers and most employers from discriminating against individuals based on genetic information (including the results of genetic tests and family history information). According to GINA, health insurance companies cannot consider genetic information to be a preexisting condition,  nor can they use it to make decisions regarding coverage or rates. GINA also makes it illegal for most employers to use genetic information in making decisions about hiring, firing, promotion, or terms of employment. It is important to note that GINA does not offer protections for life insurance, disability insurance, or long-term care insurance. GINA does not apply to those in the [employer], those who work for companies with less than 15 employees, and new life insurance or long-term disability insurance policies.  Health status due to a cancer diagnosis is not protected under GINA. More information about GINA can be found by visiting [email or web address].  The Tyrer-Cuzick model is one of multiple prediction models developed to estimate an individual's  lifetime risk of developing breast cancer. The Tyrer-Cuzick model is endorsed by the [employer] (NCCN). This model includes many risk factors such as family history, endogenous estrogen exposure, and benign breast disease. The calculation is highly-dependent on the accuracy of clinical data provided by the patient and can change over time. The Tyrer-Cuzick model may be repeated to reflect new information in her personal or family history in the future.   Based on the patient's personal and family history, a statistical model (Tyrer Cuzick/IBIS v8) was used to estimate her risk of developing breast cancer. This estimates her lifetime risk of developing breast cancer to be approximately 29.6%. This estimation does not consider any genetic testing results.  The patient's lifetime breast cancer risk is a preliminary estimate based on available information using one of several models endorsed by the American Cancer Society (ACS). The ACS recommends consideration of breast MRI screening as an adjunct to mammography for patients at high risk (defined as 20% or greater lifetime risk). Please note that a woman's breast cancer risk changes over time. It may increase or decrease based on age and any changes to the personal and/or family medical history. The risks and recommendations listed above apply to this patient at this point in time. In the future, she may or may not be eligible for the same medical management strategies and, in some cases, other medical management strategies may become available to her. If she is interested in an updated breast cancer risk assessment at a later date, she can contact us.  Ms. Mirkin has been determined to be at high risk for breast cancer.  her Tyrer-Cuzick risk score is 29.6%.  For women with a greater than 20% lifetime risk of breast cancer, the [employer] (NCCN) recommends the following:  1.      Clinical encounter every 6-12  months to begin when identified as being at increased risk, but not before age 33  2.      Annual mammograms. Tomosynthesis is recommended starting 10 years earlier than the youngest breast cancer diagnosis in the family or at age 83 (whichever comes first), but not before age 34   86.      Annual breast MRI starting 10 years earlier than the youngest breast cancer diagnosis in the family or at age 50 (whichever comes first), but not before age 40.      PLAN: After considering the risks, benefits, and limitations, Ms. Winget plans to discuss the option for updated testing with her mom. If mom agrees to updated testing, we will let mom's results guide testing recommendations for Ms. Hyman Hopes. If mom is unavailable for testing, can revisit testing for Ms. Hyman Hopes.   Based on Ms. Beining's family history, we recommended her mom, who was diagnosed  with breast cancer at age 16, have genetic counseling and testing. Ms. Knipfer will let us know if we can be of any assistance in coordinating genetic counseling and/or testing for this family member.   Consider referral to [facility] to discuss options for breast cancer surveillance and prevention.   Lastly, we encouraged Ms. Pagliarulo to remain in contact with cancer genetics annually so that we can continuously update the family history and inform her of any changes in cancer genetics and testing that may be of benefit for this family.   Ms. Loseke questions were answered to her satisfaction today. Our contact information was provided should additional questions or concerns arise. Thank you for the referral and allowing [place] to share in the care of your patient.   Vassie Moment, MS, [facility] Licensed, Retail [email or web address] phone: [phone]  50 minutes were spent on the date of the encounter in service to the patient including preparation, face-to-face consultation, documentation and care coordination.   The patient was  seen alone.  Drs. Meliton Rattan, and/or Merrill were available for questions, if needed..    _______________________________________________________________________ For Office Staff:  Number of people involved in session: 1 Was an Intern/ student involved with case: no

## 2023-08-14 ENCOUNTER — Telehealth: Payer: Self-pay | Admitting: Genetic Counselor

## 2023-08-14 NOTE — Telephone Encounter (Signed)
 Informed patient that we will be closing the referral as this is our third attempt.

## 2023-09-18 ENCOUNTER — Other Ambulatory Visit: Payer: Self-pay

## 2023-09-18 ENCOUNTER — Emergency Department

## 2023-09-18 DIAGNOSIS — S93602A Unspecified sprain of left foot, initial encounter: Secondary | ICD-10-CM | POA: Insufficient documentation

## 2023-09-18 DIAGNOSIS — Y9301 Activity, walking, marching and hiking: Secondary | ICD-10-CM | POA: Diagnosis not present

## 2023-09-18 DIAGNOSIS — Y92480 Sidewalk as the place of occurrence of the external cause: Secondary | ICD-10-CM | POA: Diagnosis not present

## 2023-09-18 DIAGNOSIS — W101XXA Fall (on)(from) sidewalk curb, initial encounter: Secondary | ICD-10-CM | POA: Insufficient documentation

## 2023-09-18 DIAGNOSIS — S99922A Unspecified injury of left foot, initial encounter: Secondary | ICD-10-CM | POA: Diagnosis present

## 2023-09-18 NOTE — ED Triage Notes (Addendum)
 Pt reports she was walking outside on the sidewalk today around 1630, stepped down off a curb and fell face forward onto the grass but did not hit her head. She states her hands broke her fall. Denies LOC. She reports pain and swelling to left foot and left ankle. + pedal pulse, + sensation and she is able to wiggle her toes. She was able to ambulate with assistance after the injury.

## 2023-09-19 ENCOUNTER — Emergency Department
Admission: EM | Admit: 2023-09-19 | Discharge: 2023-09-19 | Disposition: A | Discharge status: Home / Self Care | Attending: Emergency Medicine | Admitting: Emergency Medicine

## 2023-09-19 DIAGNOSIS — S93602A Unspecified sprain of left foot, initial encounter: Secondary | ICD-10-CM

## 2023-09-19 NOTE — ED Notes (Signed)
 Pt presented to ED with c/o left foot pain after tripping over a curb when getting out of a car. Denies hitting head, denies LOC. Pt states took motrin  and tylenol  around 5-6 pm.

## 2023-09-19 NOTE — Discharge Instructions (Signed)
 You can bear weight as tolerated and use the provided Ace wrap and crutches as needed.  You may also use one of the CAM boots that you have at home if it also helps your foot feel better.  We recommend you follow-up with the podiatrist next week; you can call the podiatry clinic first thing in the morning today to schedule follow-up appointment for next week.  Use over-the-counter ibuprofen  and Tylenol  as needed for pain control and review the information about RICE (Rest, Ice, Compression, Elevation).    Return to the emergency department if you develop new or worsening symptoms that concern you.

## 2023-09-19 NOTE — ED Notes (Signed)
Forbach MD at bedside. 

## 2023-09-19 NOTE — ED Provider Notes (Signed)
 [facility] Provider Note    Event Date/Time   First MD Initiated Contact with Patient 09/19/23 [phone]     (approximate)   History   Fall and Foot Injury   HPI Lori Cherry is a 18 y.o. female who presents for evaluation of pain to her left foot and ankle.  She stepped off of a curb wrong and fell forward.  She did not hit her head or face but caught her self with her hands.  Her wrist and hands are fine, but she has some pain in the left foot and has a hard time bearing weight.  Minimal swelling.  No bruising.  No numbness or tingling.     Physical Exam   Triage Vital Signs: ED Triage Vitals [09/18/23 2208]  Encounter Vitals Group     BP 136/78     Systolic BP Percentile      Diastolic BP Percentile      Pulse Rate (!) 102     Resp 18     Temp 98.5 F (36.9 C)     Temp Source Oral     SpO2 99 %     Weight 130.6 kg (288 lb)     Height 1.702 m (5\' 7" )     Head Circumference      Peak Flow      Pain Score 7     Pain Loc      Pain Education      Exclude from Growth Chart     Most recent vital signs: Vitals:   09/18/23 2208 09/19/23 0323  BP: 136/78 134/88  Pulse: (!) 102 91  Resp: 18 18  Temp: 98.5 F (36.9 C) 98.5 F (36.9 C)  SpO2: 99% 100%    General: Awake, no distress.  CV:  Good peripheral perfusion.  Easily palpable distal pulse in the left foot. Resp:  Normal effort. Speaking easily and comfortably, no accessory muscle usage nor intercostal retractions.   Abd:  No distention.  Other:  No visible swelling on or around the foot and ankle.  No tenderness to palpation of the midfoot nor of either the lateral or medial malleolus.  Patient is able to plantarflex and dorsiflex without reproducible pain or tenderness.  She says she feels like there is some pain on the inside.  No visible deformity.   ED Results / Procedures / Treatments   Labs (all labs ordered are listed, but only abnormal results are displayed) Labs Reviewed - No  data to display   RADIOLOGY I independently viewed and interpreted the patient's foot and ankle x-rays.  I see no evidence of fracture nor dislocation.  I also read the radiologist's report, which confirmed no acute findings.   PROCEDURES:  Critical Care performed: No  Procedures    IMPRESSION / MDM / ASSESSMENT AND PLAN / ED COURSE  I reviewed the triage vital signs and the nursing notes.                              Differential diagnosis includes, but is not limited to, sprain, strain, fracture including the possibility of a Lisfranc injury.  Patient's presentation is most consistent with acute complicated illness / injury requiring diagnostic workup.  Labs/studies ordered: Left foot x-rays, left ankle x-rays  Interventions/Medications given:  Medications - No data to display  (Note:  hospital course my include additional interventions and/or labs/studies not listed above.)  Reassuring physical exam with no obvious injury nor even any reproducible pain.  Patient is reporting some pain with weightbearing so I will provide crutches for weightbearing as tolerated and an Ace wrap.  I offered a cam boot but the patient's father said that they have boots at home that she can wear if she needs one.  I explained about a Lisfranc injury but that I thought it was very unlikely.  However I strongly encouraged her to follow-up next week with a podiatrist for further evaluation, particularly if she is still having pain.  She understands and agrees with the plan.       FINAL CLINICAL IMPRESSION(S) / ED DIAGNOSES   Final diagnoses:  Foot sprain, left, initial encounter     Rx / DC Orders   ED Discharge Orders     None        Note:  This document was prepared using Dragon voice recognition software and may include unintentional dictation errors.   Lynnda Sas, MD 09/19/23 [phone]

## 2023-11-10 ENCOUNTER — Ambulatory Visit: Admitting: Obstetrics and Gynecology

## 2023-12-18 NOTE — Progress Notes (Signed)
 18 y.o. G0P0000 Single Caucasian female here for annual exam.  Pt has concerns with birth control options.  She is now on Lamictal.    Has elevated DHEAS and testosterone  consistent with possible PCOS. She saw pediatric endocrinology due to her DHEA levels being in the 300 - 500 range for many years.   Has been using Ortho Evra and wants to discuss options for birth control.  Patch was coming off.   She currently is not using contraception.    She needs pregnancy prevention.   Uses condoms. She declines an IUD.  She has a fibroadenoma of the right breast.  Had genetic counseling but not testing done.    PCP: Patient, No Pcp Per   Patient's last menstrual period was 12/17/2023 (exact date).     Period Duration (Days): 4 Period Pattern: Regular Menstrual Flow: Heavy Menstrual Control: Tampon Dysmenorrhea: (!) Severe     Sexually active: Yes.    The current method of family planning is Ortho-Evra patches weekly.    Menopausal hormone therapy:  None Exercising: Yes.    Playing video game Just Dance for cardio Smoker:  no  OB History  Gravida Para Term Preterm AB Living  0 0 0 0 0 0  SAB IAB Ectopic Multiple Live Births  0 0 0 0 0     HEALTH MAINTENANCE: Last 2 paps:  never History of abnormal Pap or positive HPV:  n/a Mammogram:   never Colonoscopy:  never Bone Density:  never  Result  n/a   Immunization History  Administered Date(s) Administered   Influenza,inj,Quad PF,6+ Mos 02/08/2020      reports that she has never smoked. She has been exposed to tobacco smoke. She has never used smokeless tobacco. She reports that she does not currently use alcohol. She reports that she does not currently use drugs after having used the following drugs: Marijuana.  Past Medical History:  Diagnosis Date   ADHD (attention deficit hyperactivity disorder)    Depressed    PCO (polycystic ovaries)     Past Surgical History:  Procedure Laterality Date   BREAST BIOPSY Right  08/06/2023   US  RT BREAST BX W LOC DEV 1ST LESION IMG BX SPEC US  GUIDE 08/06/2023 [facility] MAMMO[facility]AP[facility]   ROBOTIC ASSISTED LAPAROSCOPIC OVARIAN CYSTECTOMY Left 05/05/2021   Procedure: XI ROBOTIC ASSISTED LAPAROSCOPIC OVARIAN CYSTECTOMY;  Surgeon: Victor Claudell SAUNDERS, MD;  Location: [facility] ORS;  Service: Gynecology;  Laterality: Left;    Current Outpatient Medications  Medication Sig Dispense Refill   albuterol (VENTOLIN HFA) 108 (90 Base) MCG/ACT inhaler Inhale into the lungs.     ibuprofen  (ADVIL ) 600 MG tablet Take 1 tablet (600 mg total) by mouth every 6 (six) hours as needed. 60 tablet 0   lamoTRIgine (LAMICTAL) 200 MG tablet Take 200 mg by mouth daily.     norelgestromin -ethinyl estradiol  (XULANE) 150-35 MCG/24HR transdermal patch Place 1 patch onto the skin once a week. Change the patch weekly for 3 weeks and then wear no patch for one week.  Then restart the process again. (Patient not taking: Reported on 12/22/2023) 3 patch 6   No current facility-administered medications for this visit.    ALLERGIES: Patient has no known allergies.  Family History  Problem Relation Age of Onset   Breast cancer Mother 62   Lung cancer Maternal Grandmother    Brain cancer Maternal Grandfather    Endometrial cancer Paternal Grandmother 75   Breast cancer Maternal Great-grandmother     Review of  Systems  PHYSICAL EXAM:  BP 102/64 (BP Location: Left Arm, Patient Position: Sitting, Cuff Size: Normal)   Pulse 96   Ht 5' 7.25 (1.708 m)   Wt 295 lb (133.8 kg)   LMP 12/17/2023 (Exact Date)   SpO2 97%   BMI 45.86 kg/m     General appearance: alert, cooperative and appears stated age Head: normocephalic, without obvious abnormality, atraumatic Neck: no adenopathy, supple, symmetrical, trachea midline and thyroid normal to inspection and palpation Lungs: clear to auscultation bilaterally Breasts: left - normal appearance, no masses or tenderness, No nipple retraction or dimpling, No nipple discharge  or bleeding, No axillary adenopathy Right - ridge at 12:00, no tenderness, No nipple retraction or dimpling, No nipple discharge or bleeding, No axillary adenopathy Heart: regular rate and rhythm Abdomen: soft, non-tender; no masses, no organomegaly Extremities: extremities normal, atraumatic, no cyanosis or edema Neurologic: grossly normal   Chaperone was present for exam:  Clotilda Pa, CMA  ASSESSMENT: Well woman visit with gynecologic exam. PCOS.  Elevated DHEAS.  Previously followed by pediatric endocrinology. Encounter for birth control counseling.  On Lamictal.  PHQ-2-9: 0 Possible migraine without aura.  Hx ovarian torsion.  FH breast cancer in mother at age 56 yo.  Increased risk of breast cancer.  29.6%.  had genetic counseling and not testing.  Right fibroadenoma.  STD screening.   PLAN: Mammogram screening and breast MRI discussed.  Start at age 25 yo for each of these.   Self breast awareness reviewed. Pap and HRV collected:  no.  Start at age 5 yo.  Guidelines for Calcium, Vitamin D, regular exercise program including cardiovascular and weight bearing exercise. Medication refills:  NA.  Discussed long acting contraception options:  Nexplanon, Depo Provera, and IUDs.  She will pursue Nexplanon.  Risk and benefits reviewed. Brochure given.  Referral for genetic testing.   Her mother was not able to be tested.  Referral to endocrinology for elevated DHEAS. STD screening.   Follow up:  yearly and prn.

## 2023-12-22 ENCOUNTER — Encounter: Payer: Self-pay | Admitting: Obstetrics and Gynecology

## 2023-12-22 ENCOUNTER — Other Ambulatory Visit (HOSPITAL_COMMUNITY)
Admission: RE | Admit: 2023-12-22 | Discharge: 2023-12-22 | Disposition: A | Discharge status: Home / Self Care | Source: Ambulatory Visit | Attending: Obstetrics and Gynecology | Admitting: Obstetrics and Gynecology

## 2023-12-22 ENCOUNTER — Ambulatory Visit (INDEPENDENT_AMBULATORY_CARE_PROVIDER_SITE_OTHER): Payer: 59 | Admitting: Obstetrics and Gynecology

## 2023-12-22 VITALS — BP 102/64 | HR 96 | Ht 67.25 in | Wt 295.0 lb

## 2023-12-22 DIAGNOSIS — Z5181 Encounter for therapeutic drug level monitoring: Secondary | ICD-10-CM

## 2023-12-22 DIAGNOSIS — Z01419 Encounter for gynecological examination (general) (routine) without abnormal findings: Secondary | ICD-10-CM

## 2023-12-22 DIAGNOSIS — Z1331 Encounter for screening for depression: Secondary | ICD-10-CM | POA: Diagnosis not present

## 2023-12-22 DIAGNOSIS — Z1159 Encounter for screening for other viral diseases: Secondary | ICD-10-CM

## 2023-12-22 DIAGNOSIS — Z113 Encounter for screening for infections with a predominantly sexual mode of transmission: Secondary | ICD-10-CM | POA: Insufficient documentation

## 2023-12-22 DIAGNOSIS — Z114 Encounter for screening for human immunodeficiency virus [HIV]: Secondary | ICD-10-CM

## 2023-12-22 DIAGNOSIS — Z9189 Other specified personal risk factors, not elsewhere classified: Secondary | ICD-10-CM

## 2023-12-22 DIAGNOSIS — Z308 Encounter for other contraceptive management: Secondary | ICD-10-CM

## 2023-12-22 DIAGNOSIS — Z803 Family history of malignant neoplasm of breast: Secondary | ICD-10-CM

## 2023-12-22 DIAGNOSIS — Z0000 Encounter for general adult medical examination without abnormal findings: Secondary | ICD-10-CM

## 2023-12-22 DIAGNOSIS — R7989 Other specified abnormal findings of blood chemistry: Secondary | ICD-10-CM

## 2023-12-22 NOTE — Patient Instructions (Signed)

## 2023-12-23 LAB — CERVICOVAGINAL ANCILLARY ONLY
Chlamydia: NEGATIVE
Comment: NEGATIVE
Comment: NEGATIVE
Comment: NORMAL
Neisseria Gonorrhea: NEGATIVE
Trichomonas: NEGATIVE

## 2023-12-23 LAB — HIV ANTIBODY (ROUTINE TESTING W REFLEX): HIV 1&2 Ab, 4th Generation: NONREACTIVE

## 2023-12-23 LAB — RPR: RPR Ser Ql: NONREACTIVE

## 2023-12-23 LAB — HEPATITIS C ANTIBODY: Hepatitis C Ab: NONREACTIVE

## 2023-12-25 ENCOUNTER — Ambulatory Visit: Payer: Self-pay | Admitting: Obstetrics and Gynecology

## 2024-02-17 NOTE — Patient Instructions (Signed)
 Etonogestrel Implant Nexplanon Instructions After Insertion  Keep bandage clean and dry for 24 hours  May use ice/Tylenol /Ibuprofen  for soreness or pain  If you develop fever, drainage or increased warmth from incision site-contact office immediately   What is this medication? ETONOGESTREL (et oh noe JES trel) prevents ovulation and pregnancy. It belongs to a group of medications called contraceptives. This medication is a progestin hormone. This medicine may be used for other purposes; ask your health care provider or pharmacist if you have questions. COMMON BRAND NAME(S): Implanon, Nexplanon What should I tell my care team before I take this medication? They need to know if you have any of these conditions: Abnormal vaginal bleeding Blood clots Blood vessel disease Breast, cervical, endometrial, ovarian, liver, or uterine cancer Diabetes Gallbladder disease Heart disease or recent heart attack High blood pressure High cholesterol or triglycerides Kidney disease Liver disease Migraine headaches Seizures Stroke Tobacco use An unusual or allergic reaction to etonogestrel, other medications, foods, dyes, or preservatives Pregnant or trying to get pregnant Breastfeeding How should I use this medication? This device is inserted just under the skin on the inner side of your upper arm by your care team. Talk to your care team about the use of this medication in children. Special care may be needed. Overdosage: If you think you have taken too much of this medicine contact a poison control center or emergency room at once. NOTE: This medicine is only for you. Do not share this medicine with others. What if I miss a dose? This does not apply. What may interact with this medication? Do not take this medication with any of the following: Amprenavir Fosamprenavir This medication may also interact with the  following: Acitretin Aprepitant Armodafinil Bexarotene Bosentan Carbamazepine Certain antivirals for HIV or hepatitis Certain medications for fungal infections, such as fluconazole , ketoconazole, itraconazole, or voriconazole Cyclosporine Felbamate Griseofulvin Lamotrigine Modafinil Oxcarbazepine Phenobarbital Phenytoin Primidone Rifabutin Rifampin Rifapentine St. John's wort Topiramate This list may not describe all possible interactions. Give your health care provider a list of all the medicines, herbs, non-prescription drugs, or dietary supplements you use. Also tell them if you smoke, drink alcohol, or use illegal drugs. Some items may interact with your medicine. What should I watch for while using this medication? Visit your care team for regular checks on your progress. Using this medication does not protect you or your partner against HIV or other sexually transmitted infections (STIs). You should be able to feel the implant by pressing your fingertips over the skin where it was inserted. Contact your care team if you cannot feel the implant, and use a non-hormonal birth control method (such as condoms) until your care team confirms that the implant is in place. Contact your care team if you think that the implant may have broken or become bent while in your arm. You will receive a user card from your care team after the implant is inserted. The card is a record of the location of the implant in your upper arm and when it should be removed. Keep this card with your health records. What side effects may I notice from receiving this medication? Side effects that you should report to your care team as soon as possible: Allergic reactions--skin rash, itching, hives, swelling of the face, lips, tongue, or throat Blood clot--pain, swelling, or warmth in the leg, shortness of breath, chest pain Gallbladder problems--severe stomach pain, nausea, vomiting, fever Increase in blood  pressure Liver injury--right upper belly pain, loss of appetite,  nausea, light-colored stool, dark yellow or brown urine, yellowing skin or eyes, unusual weakness or fatigue New or worsening migraines or headaches Pain, redness, or irritation at injection site Stroke--sudden numbness or weakness of the face, arm, or leg, trouble speaking, confusion, trouble walking, loss of balance or coordination, dizziness, severe headache, change in vision Unusual vaginal discharge, itching, or odor Worsening mood, feelings of depression Side effects that usually do not require medical attention (report to your care team if they continue or are bothersome): Breast pain or tenderness Dark patches of skin on the face or other sun-exposed areas Irregular menstrual cycles or spotting Nausea Weight gain This list may not describe all possible side effects. Call your doctor for medical advice about side effects. You may report side effects to FDA at 1-800-FDA-1088. Where should I keep my medication? This medication is given in a hospital or clinic and will not be stored at home. NOTE: This sheet is a summary. It may not cover all possible information. If you have questions about this medicine, talk to your doctor, pharmacist, or health care provider.  2024 [employer]vier/Gold Standard (2021-12-04 00:00:00)

## 2024-02-17 NOTE — Progress Notes (Unsigned)
      GYNECOLOGY OFFICE PROCEDURE NOTE  Lori Cherry is a 18 y.o. G0P0000 here for Nexplanon insertion. No pap history, not age appropriate. No other gynecologic concerns.  Nexplanon Insertion Procedure Patient identified, informed consent performed, consent signed.   Patient does understand that irregular bleeding is a very common side effect of this medication. She was advised to have backup contraception for one week after placement. Pregnancy test in clinic today was negative.  Appropriate time out taken.  Patient's left arm was prepped and draped in the usual sterile fashion. The ruler used to measure and mark insertion area.  Patient was prepped with alcohol swab and then injected with 3 ml of 1% lidocaine .  She was prepped with betadine, Nexplanon removed from packaging,  Device confirmed in needle, then inserted full length of needle and withdrawn per handbook instructions. Nexplanon was able to palpated in the patient's arm; patient palpated the insert herself. There was minimal blood loss.  Patient insertion site covered with guaze and a pressure bandage to reduce any bruising.  The patient tolerated the procedure well and was given post procedure instructions.    Damien Parsley, CNM [facility]

## 2024-02-18 ENCOUNTER — Encounter: Payer: Self-pay | Admitting: Certified Nurse Midwife

## 2024-02-18 ENCOUNTER — Ambulatory Visit: Admitting: Certified Nurse Midwife

## 2024-02-18 ENCOUNTER — Telehealth: Payer: Self-pay | Admitting: Genetic Counselor

## 2024-02-18 VITALS — BP 127/90 | HR 84 | Resp 16 | Ht 67.25 in | Wt 290.5 lb

## 2024-02-18 DIAGNOSIS — Z30017 Encounter for initial prescription of implantable subdermal contraceptive: Secondary | ICD-10-CM | POA: Diagnosis not present

## 2024-02-18 NOTE — Telephone Encounter (Signed)
 LVM asking for call back. Received second referral for genetic counseling. Genetic counseling completed 08/11/23, plan was for Lori Cherry to see if her mother was interested in completing testing first, consider Cancer Risk referral. Unknown if mom has completed testing, following up with patient.

## 2024-02-24 ENCOUNTER — Telehealth: Payer: Self-pay | Admitting: Obstetrics and Gynecology

## 2024-02-24 NOTE — Telephone Encounter (Signed)
 Please let the patient know that the endocrinologist declined the referral for her elevated DHEAS.   The DHEA level can be followed with a PCP, her GYN provider at [facility] or by me at this office.   Please let me know her preference.

## 2024-02-25 NOTE — Telephone Encounter (Signed)
 Call placed to patient, left detailed message, ok per dpr. Advised per Dr. Nikki, return call to advise how you want to proceed. [phone], opt 4.

## 2024-03-12 NOTE — Telephone Encounter (Signed)
 2nd call to patient.  Left detailed message advising per Dr. Nikki. Return call to office to advise how you would like to proceed.    Per review of EPIC, patient is scheduled to see new PCP 04/06/24.   Routing to Dr. Nikki to advise.

## 2024-03-15 NOTE — Telephone Encounter (Signed)
 Patient has been advised.  I think it is ok to close this encounter.

## 2024-03-17 NOTE — Telephone Encounter (Signed)
 Follow up received by me from [facility] regarding referral for elevated DHEA level.   It appears that one provider declined the referral and then another provider accepted the referral.  They have been trying to reach the patient to schedule an appointment, and they have not received a response from her.    They have closed the referral and can reopen it if the patient calls to schedule.

## 2024-04-06 ENCOUNTER — Ambulatory Visit

## 2024-04-21 NOTE — Telephone Encounter (Signed)
 Please leave message for patient with this last information and then close encounter.

## 2024-04-22 NOTE — Telephone Encounter (Signed)
Call placed to patient, left detailed message, ok per dpr.  Advised per Dr. Edward Jolly. Return call to office at [phone], option 4,  if any additional questions.    Encounter closed.

## 2024-05-28 ENCOUNTER — Ambulatory Visit

## 2024-07-27 ENCOUNTER — Ambulatory Visit

## 2024-12-22 ENCOUNTER — Ambulatory Visit: Admitting: Obstetrics and Gynecology
